# Patient Record
Sex: Female | Born: 1970 | Race: Black or African American | Hispanic: No | Marital: Married | Smoking: Never smoker
Health system: Southern US, Community
[De-identification: ages and names within clinical notes are randomized; demographics above are authoritative.]

---

## 2015-04-06 ENCOUNTER — Emergency Department (HOSPITAL_COMMUNITY)
Admission: EM | Admit: 2015-04-06 | Discharge: 2015-04-06 | Disposition: A | Discharge status: Home / Self Care | Payer: No Typology Code available for payment source | Attending: Physician Assistant | Admitting: Physician Assistant

## 2015-04-06 ENCOUNTER — Emergency Department (HOSPITAL_COMMUNITY): Payer: No Typology Code available for payment source

## 2015-04-06 ENCOUNTER — Encounter (HOSPITAL_COMMUNITY): Payer: Self-pay

## 2015-04-06 DIAGNOSIS — S3992XA Unspecified injury of lower back, initial encounter: Secondary | ICD-10-CM | POA: Diagnosis present

## 2015-04-06 DIAGNOSIS — Y998 Other external cause status: Secondary | ICD-10-CM | POA: Insufficient documentation

## 2015-04-06 DIAGNOSIS — S4992XA Unspecified injury of left shoulder and upper arm, initial encounter: Secondary | ICD-10-CM | POA: Diagnosis not present

## 2015-04-06 DIAGNOSIS — Y9389 Activity, other specified: Secondary | ICD-10-CM | POA: Diagnosis not present

## 2015-04-06 DIAGNOSIS — Y92410 Unspecified street and highway as the place of occurrence of the external cause: Secondary | ICD-10-CM | POA: Diagnosis not present

## 2015-04-06 MED ORDER — IBUPROFEN 800 MG PO TABS: 800.0000 mg | ORAL_TABLET | Freq: Three times a day (TID) | ORAL | Status: DC

## 2015-04-06 MED ORDER — CYCLOBENZAPRINE HCL 10 MG PO TABS: 10.0000 mg | ORAL_TABLET | Freq: Two times a day (BID) | ORAL | Status: DC | PRN

## 2015-04-06 MED ORDER — CYCLOBENZAPRINE HCL 10 MG PO TABS
10.0000 mg | ORAL_TABLET | Freq: Once | ORAL | Status: AC
  Administered 2015-04-06: 10 mg via ORAL
  Filled 2015-04-06: qty 1

## 2015-04-06 MED ORDER — IBUPROFEN 800 MG PO TABS
800.0000 mg | ORAL_TABLET | Freq: Once | ORAL | Status: AC
  Administered 2015-04-06: 800 mg via ORAL
  Filled 2015-04-06: qty 1

## 2015-04-06 NOTE — Discharge Instructions (Signed)
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
## 2015-04-06 NOTE — ED Notes (Signed)
Pt stable, ambulatory, states understanding of discharge instructions 
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
## 2015-04-26 ENCOUNTER — Emergency Department (HOSPITAL_COMMUNITY)
Admission: EM | Admit: 2015-04-26 | Discharge: 2015-04-26 | Disposition: A | Discharge status: Home / Self Care | Payer: No Typology Code available for payment source | Attending: Emergency Medicine | Admitting: Emergency Medicine

## 2015-04-26 ENCOUNTER — Encounter (HOSPITAL_COMMUNITY): Payer: Self-pay | Admitting: Emergency Medicine

## 2015-04-26 ENCOUNTER — Emergency Department (HOSPITAL_COMMUNITY): Payer: No Typology Code available for payment source

## 2015-04-26 DIAGNOSIS — M79602 Pain in left arm: Secondary | ICD-10-CM | POA: Diagnosis present

## 2015-04-26 DIAGNOSIS — R202 Paresthesia of skin: Secondary | ICD-10-CM | POA: Diagnosis not present

## 2015-04-26 DIAGNOSIS — S4992XD Unspecified injury of left shoulder and upper arm, subsequent encounter: Secondary | ICD-10-CM | POA: Insufficient documentation

## 2015-04-26 DIAGNOSIS — R531 Weakness: Secondary | ICD-10-CM | POA: Insufficient documentation

## 2015-04-26 DIAGNOSIS — R29898 Other symptoms and signs involving the musculoskeletal system: Secondary | ICD-10-CM

## 2015-04-26 MED ORDER — CYCLOBENZAPRINE HCL 10 MG PO TABS: 10.0000 mg | ORAL_TABLET | Freq: Two times a day (BID) | ORAL | Status: DC | PRN

## 2015-04-26 MED ORDER — IBUPROFEN 800 MG PO TABS: 800.0000 mg | ORAL_TABLET | Freq: Three times a day (TID) | ORAL | Status: DC

## 2015-04-26 MED ORDER — METHOCARBAMOL 500 MG PO TABS
500.0000 mg | ORAL_TABLET | Freq: Once | ORAL | Status: AC
  Administered 2015-04-26: 500 mg via ORAL
  Filled 2015-04-26: qty 1

## 2015-04-26 MED ORDER — IBUPROFEN 400 MG PO TABS
800.0000 mg | ORAL_TABLET | Freq: Once | ORAL | Status: AC
  Administered 2015-04-26: 800 mg via ORAL
  Filled 2015-04-26: qty 2
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
## 2015-04-26 NOTE — ED Notes (Signed)
WAITING FOR MRI

## 2015-04-26 NOTE — Discharge Instructions (Signed)
1. Medications: take ibuprofen and flexeril as needed for pain control, usual home medications 2. Treatment: rest, ice, elevate and use brace, drink plenty of fluids, gentle stretching 3. Follow Up: Please followup with orthopedics as directed or your PCP in 1 week if no improvement for discussion of your diagnoses and further evaluation after today's visit; if you do not have a primary care doctor use the resource guide provided to find one; Please return to the ER for worsening symptoms or other concerns   Emergency Department Resource Guide 1) Find a Doctor and Pay Out of Pocket Although you won't have to find out who is covered by your insurance plan, it is a good idea to ask around and get recommendations. You will then need to call the office and see if the doctor you have chosen will accept you as a new patient and what types of options they offer for patients who are self-pay. Some doctors offer discounts or will set up payment plans for their patients who do not have insurance, but you will need to ask so you aren't surprised when you get to your appointment.  2) Contact Your Local Health Department Not all health departments have doctors that can see patients for sick visits, but many do, so it is worth a call to see if yours does. If you don't know where your local health department is, you can check in your phone book. The CDC also has a tool to help you locate your state's health department, and many state websites also have listings of all of their local health departments.  3) Find a Walk-in Clinic If your illness is not likely to be very severe or complicated, you may want to try a walk in clinic. These are popping up all over the country in pharmacies, drugstores, and shopping centers. They're usually staffed by nurse practitioners or physician assistants that have been trained to treat common illnesses and complaints. They're usually fairly quick and inexpensive. However, if you have  serious medical issues or chronic medical problems, these are probably not your best option.  No Primary Care Doctor: - Call Health Connect at  [phone] - they can help you locate a primary care doctor that  accepts your insurance, provides certain services, etc. - Physician Referral Service- [phone]  Chronic Pain Problems: Organization         Address  Phone   Notes  [facility]  [phone] Patients need to be referred by their primary care doctor.   Medication Assistance: Organization         Address  Phone   Notes  [facility] [address] [place] [place], [place] [address] [phone] --Must be a resident of [facility] -- Must have NO insurance coverage whatsoever (no Medicaid/ Medicare, etc.) -- The pt. MUST have a primary care doctor that directs their care regularly and follows them in the community   [facility]  [phone]   [employer]  [phone]    Agencies that provide inexpensive medical care: Organization         Address  Phone   Notes  [facility]  [phone]   [facility]    [phone]   [facility] [address] [place], [place] [address] [phone]   [facility] [address] [place] [place] [phone]   [facility]anned [facility]    [phone]  [facility]    ([phone]) [phone]   °[facility] ° [address], [place] Phone:  ([phone]) [phone], Fax:  [phone] Hours of Operation:  9 am - 6 pm, M-F.  Also accepts Medicaid/Medicare and self-pay.  °[facility] ° [address], [place] Phone: ([phone]) [phone], Fax: [phone]. Hours of Operation:  8:30 am - 5:30 pm, M-F.  Also accepts Medicaid and self-pay.  °[facility] [place] [address], [place] Phone: ([phone]) [phone]   °[facility] [address],  [place], [place] (336)[phone], Ext. 123 Mondays & Thursdays: 7-9 AM.  First 15 patients are seen on a first come, first serve basis. °  ° °Medicaid-accepting [place] Providers: ° °Organization         Address  Phone   Notes  °[facility] [address], [place] ([phone]) [phone] Also accepts self-pay patients.  °[facility] [address], [place] ° (336) [phone]   °[facility] [address], [place] (336) [phone]   °[facility] [address] [place] [address], [place] (336) [phone]   °Veita Bland [address], [place]  ° ([phone]) [phone] Only accepts [place] Access Medicaid patients after they have their name applied to their card.  ° °Self-Pay (no insurance) in [place]: ° °Organization         Address  Phone   Notes  °Sickle Cell Patients, [facility] [address], [place] (336) [phone]   °[facility] [address], [place] (336) [phone]   °[facility] ° [address], [place] (336) [phone]   °[facility]/Dr. Osei-Bonsu ° [address] [place] [address], [place] or [address], [place] ([phone]) [phone] Phone number for both [place] and [place] locations is the same.  °[facility] [address], [place] (336) [phone]   °[facility] [address] [place] [address], [place] or [address] (336) 852-7530 °(336) 878-2260   °[facility] [address],  (336) 350-1642, phone; (336) [phone], fax Sees patients 1st and 3rd Saturday of every month.  Must not qualify for public or private insurance (i.e. Medicaid, Medicare, [place] Health Choice, Veterans' Benefits) • Household income should be no more than 200% of the poverty level •The clinic cannot treat you if you are pregnant or think you are pregnant •  Sexually transmitted diseases are not treated at the clinic.  ° ° °Dental Care: °Organization         Address  Phone  Notes  °[place] [facility] [address],  (336) [phone] Accepts children up to age 21 who are enrolled in Medicaid or [place] Health Choice; pregnant women with a Medicaid card; and children who have applied for Medicaid or [place] Health Choice, but were declined, whose parents can pay a reduced fee at time of service.  °[place] [facility] [place]  [address], [place] (336) 641-7733 Accepts children up to age 21 who are enrolled in Medicaid or [place] Health Choice; pregnant women with a Medicaid card; and children who have applied for Medicaid or [place] Health Choice, but were declined, whose parents can pay a reduced fee at time   of service.  °[facility] ° [address], [place] (336) [phone] Patients are seen by appointment only. Walk-ins are not accepted. [facility] will see patients 18 years of age and older. °Monday - Tuesday (8am-5pm) °Most Wednesdays (8:30-5pm) °$30 per visit, cash only  °[facility] ° [address], [place] (336) [phone] Patients are seen by appointment only. Walk-ins are not accepted. [facility] will see patients 18 years of age and older. °One Wednesday Evening (Monthly: Volunteer Based).  $30 per visit, cash only  °[facility]  ([phone]) [phone] for adults; Children under age 4, call Graduate Pediatric Dentistry at ([phone]. Children aged 4-14, please call [phone] to request a pediatric application. ° Dental services are provided in all areas of dental care including fillings, crowns and bridges, complete and partial dentures, implants, gum treatment, root canals, and extractions. Preventive care is also provided. Treatment is provided to both adults and children. °Patients  are selected via a lottery and there is often a waiting list. °  °[facility] [address], °[place] ° (336) 763-8833 [email or web address] °  °[facility] [address], [place], [place] (336)723-1848, Ext. 123 Second and Fourth Thursday of each month, opens at 6:30 AM; Clinic ends at 9 AM.  Patients are seen on a first-come first-served basis, and a limited number are seen during each clinic.  ° °[facility] ° [address], [place], [place] (336) [phone]   Eligibility Requirements °You must have lived in [place], [place], or [place] counties for at least the last three months. °  You cannot be eligible for state or federal sponsored healthcare insurance, including Veterans Administration, Medicaid, or Medicare. °  You generally cannot be eligible for healthcare insurance through your employer.  °  How to apply: °Eligibility screenings are held every Tuesday and Wednesday afternoon from 1:00 pm until 4:00 pm. You do not need an appointment for the interview!  °[facility] [address], [place], [place] [phone]   °[facility]  336-342-8273   °[facility]  336-703-3100   °[facility]  336-570-6415   ° °Behavioral Health Resources in the Community: °Intensive Outpatient Programs °Organization         Address  Phone  Notes  °[facility] [address], [place], [place] 336-878-6098   °[facility] [address], [place], [place] 336-832-9800   °[facility] [address], [place], [place] ° 336-882-2125   °[place] [address],  °[place], [place] 1-800-853-5163 or 336-641-4981   °Substance Abuse Resources °Organization         Address  Phone  Notes  °[facility]  336-882-2125   °[facility]  336-784-9470   °[facility]  336-285-9073   °[facility]  336-845-3988    °Residential & Outpatient Substance Abuse Program  1-800-659-3381   °Psychological Services °Organization         Address  Phone  Notes  °[facility]  336- 832-9600   °[facility]  336- 378-7881   °[facility] [address], [place] 1-800-853-5163 or 336-641-4981   ° °Mobile Crisis Teams °Organization         Address  Phone  Notes  °[facility], Mobile Crisis Care Unit  1-877-626-1772   °Assertive °Psychotherapeutic   Services  [address] [place], [place] [phone]   [facility] [address] [place] [phone]    Self-Help/Support Groups Organization         Address  Phone             Notes  [facility] - variety of support groups  336- I7437963(636)[phone] Call for more information  Narcotics Anonymous (NA), [facility] [address], [place]  2 meetings at this location   [facility] Treatment Programs Organization         Address  Phone  Notes  [facility] [address],    [place]  [phone]   [facility]  [address], [place] [phone], [place], [place] [phone]   [facility] [address], [place] [phone] Admissions: 8am-3pm M-F  [facility] [address]    [place], [place] [phone]   [facility] [address], [place], [place] [phone]   [facility] [address]  [place], [place] [phone]   [facility] Intensive Outpatient [address], [place], [place] [phone]   [facility] ([facility] [address]  [place], [place] [phone]) [phone] or [phone]   [facility] ([facility] [address] [place], [place] [phone] Accepts Medicaid  [facility] [address]  [place] [phone] Substance Abuse/Addiction Treatment   [facility]  Resources Organization         Address  Phone  Notes  [facility]  [phone](888) [phone]   Angie FavaJulie Brannon, PhD [address], [address] [place], [place]   [phone](336) [phone] or [phone](336) [phone]   [facility]   [address] [place], [place] [phone]) [phone]   [facility] [address], [place], [place] [phone]) [phone] Insurance/Medicaid/sponsorship through [facility]  [facility] [address]                                    [place], [place] [phone]) [phone] Therapy/tele-psych/case  [facility] [address]   [place], [place] [phone](336) [phone]    Dr. Lolly MustacheArfeen  [phone](336) [phone]   [facility]  [employer] [facility] 1) [address], [place] 2) [address], [place] 3)  [address], [place] [phone](336) [phone]  [phone]   [facility] Child Abuse Hotline [phone](336) [phone] or [phone](336) [phone] (After Hours)
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
## 2015-04-26 NOTE — ED Notes (Signed)
Patient still in MRI.  
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
## 2019-06-15 ENCOUNTER — Ambulatory Visit: Payer: 59 | Admitting: Allergy and Immunology

## 2020-10-30 ENCOUNTER — Encounter: Payer: Self-pay | Admitting: Gastroenterology

## 2021-01-08 ENCOUNTER — Encounter: Payer: Self-pay | Admitting: Gastroenterology

## 2021-04-04 ENCOUNTER — Other Ambulatory Visit: Payer: Self-pay

## 2021-04-04 ENCOUNTER — Emergency Department (HOSPITAL_BASED_OUTPATIENT_CLINIC_OR_DEPARTMENT_OTHER)
Admission: EM | Admit: 2021-04-04 | Discharge: 2021-04-04 | Disposition: A | Discharge status: Home / Self Care | Payer: BC Managed Care – PPO | Attending: Emergency Medicine | Admitting: Emergency Medicine

## 2021-04-04 ENCOUNTER — Encounter (HOSPITAL_BASED_OUTPATIENT_CLINIC_OR_DEPARTMENT_OTHER): Payer: Self-pay

## 2021-04-04 DIAGNOSIS — M546 Pain in thoracic spine: Secondary | ICD-10-CM | POA: Insufficient documentation

## 2021-04-04 DIAGNOSIS — R0789 Other chest pain: Secondary | ICD-10-CM | POA: Diagnosis present

## 2021-04-04 DIAGNOSIS — N644 Mastodynia: Secondary | ICD-10-CM | POA: Diagnosis not present

## 2021-04-04 DIAGNOSIS — B029 Zoster without complications: Secondary | ICD-10-CM | POA: Insufficient documentation

## 2021-04-04 LAB — COMPREHENSIVE METABOLIC PANEL
ALT: 27 U/L (ref 0–44)
AST: 25 U/L (ref 15–41)
Albumin: 4.3 g/dL (ref 3.5–5.0)
Alkaline Phosphatase: 106 U/L (ref 38–126)
Anion gap: 7 (ref 5–15)
BUN: 6 mg/dL (ref 6–20)
CO2: 29 mmol/L (ref 22–32)
Calcium: 9.5 mg/dL (ref 8.9–10.3)
Chloride: 104 mmol/L (ref 98–111)
Creatinine, Ser: 0.66 mg/dL (ref 0.44–1.00)
GFR, Estimated: >60 mL/min (ref >60)
Glucose, Bld: 90 mg/dL (ref 70–99)
Potassium: 3.8 mmol/L (ref 3.5–5.1)
Sodium: 140 mmol/L (ref 135–145)
Total Bilirubin: 0.6 mg/dL (ref 0.3–1.2)
Total Protein: 7.6 g/dL (ref 6.5–8.1)

## 2021-04-04 LAB — CBC
HCT: 39 % (ref 36.0–46.0)
Hemoglobin: 12.6 g/dL (ref 12.0–15.0)
MCH: 28 pg (ref 26.0–34.0)
MCHC: 32.3 g/dL (ref 30.0–36.0)
MCV: 86.7 fL (ref 80.0–100.0)
Platelets: 233 10*3/uL (ref 150–400)
RBC: 4.5 MIL/uL (ref 3.87–5.11)
RDW: 13.1 % (ref 11.5–15.5)
WBC: 4.1 10*3/uL (ref 4.0–10.5)
nRBC: 0 % (ref 0.0–0.2)

## 2021-04-04 LAB — URINALYSIS, ROUTINE W REFLEX MICROSCOPIC
Bilirubin Urine: NEGATIVE
Glucose, UA: NEGATIVE mg/dL
Hgb urine dipstick: NEGATIVE
Ketones, ur: NEGATIVE mg/dL
Nitrite: NEGATIVE
Protein, ur: NEGATIVE mg/dL
Specific Gravity, Urine: <1.005 — ABNORMAL LOW (ref 1.005–1.030)
pH: 6 (ref 5.0–8.0)

## 2021-04-04 LAB — TROPONIN I (HIGH SENSITIVITY)
Troponin I (High Sensitivity): 4 ng/L (ref <18)
Troponin I (High Sensitivity): 3 ng/L (ref <18)

## 2021-04-04 LAB — PREGNANCY, URINE: Preg Test, Ur: NEGATIVE

## 2021-04-04 LAB — LIPASE, BLOOD: Lipase: 29 U/L (ref 11–51)

## 2021-04-04 LAB — D-DIMER, QUANTITATIVE: D-Dimer, Quant: 0.34 ug/mL-FEU (ref 0.00–0.50)

## 2021-04-04 MED ORDER — VALACYCLOVIR HCL 1 G PO TABS: 1000.0000 mg | ORAL_TABLET | Freq: Three times a day (TID) | ORAL | 0 refills | Status: DC
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
## 2021-04-04 NOTE — Discharge Instructions (Addendum)
You were evaluated in the Emergency Department and after careful evaluation, we did not find any emergent condition requiring admission or further testing in the hospital.  Your exam/testing today was overall reassuring.  We have ruled out heart attack with cardiac enzymes and EKG, ruled out fluid on your lungs with an ultrasound, ruled out fluid on your heart with an ultrasound, ruled out blood clot in your lungs with laboratory testing.  Your clinical presentation is most consistent with shingles.  I have prescribed Valtrex which is a 3 times a day medication.  It is a white pill.  I have attached additional educational materials to your AVS regarding shingles.  Please return to the Emergency Department if you experience any worsening of your condition.  Thank you for allowing [place] to be a part of your care.

## 2021-04-04 NOTE — ED Provider Notes (Signed)
[facility] EMERGENCY DEPT Provider Note   CSN: [id number] Arrival date & time: 04/04/21  5638     History Chief Complaint  Patient presents with   Chest Pain    Lynn Blair is a 50 y.o. female.   Chest Pain Associated symptoms: back pain   Associated symptoms: no abdominal pain, no cough, no fever, no palpitations, no shortness of breath and no vomiting    50 year old female with no significant past medical history presenting to the emergency department with a sensation of "something filling up under my ribs."  She endorses a burning pain in her left thoracic back that radiates around to her left breast.  She states the pain has been present since last night.  She denies a history of shingles.  She endorses a component of pressure with the pain.  She is having difficulty characterizing the discomfort to me on HPI.  She denies any shortness of breath.  She denies any fevers or chills.  She denies any cough.  She denies any other infectious symptoms.  Nothing makes her symptoms better or worse.  She denies any vesicular rash.  History reviewed. No pertinent past medical history.  There are no problems to display for this patient.   History reviewed. No pertinent surgical history.   OB History   No obstetric history on file.     No family history on file.  Social History   Tobacco Use   Smoking status: Never   Smokeless tobacco: Never  Vaping Use   Vaping Use: Never used  Substance Use Topics   Alcohol use: No   Drug use: No    Home Medications Prior to Admission medications   Medication Sig Start Date End Date Taking? Authorizing Provider  valACYclovir (VALTREX) 1000 MG tablet Take 1 tablet (1,000 mg total) by mouth 3 (three) times daily. 04/04/21  Yes Ernie Avena, MD  cyclobenzaprine (FLEXERIL) 10 MG tablet Take 1 tablet (10 mg total) by mouth 2 (two) times daily as needed for muscle spasms. 04/26/15   Muthersbaugh, Dahlia Client, PA-C  ibuprofen  (ADVIL,MOTRIN) 800 MG tablet Take 1 tablet (800 mg total) by mouth 3 (three) times daily. 04/26/15   Muthersbaugh, Dahlia Client, PA-C    Allergies    Citrus, Red dye, Shellfish allergy, and Yellow dyes (non-tartrazine)  Review of Systems   Review of Systems  Constitutional:  Negative for chills and fever.  HENT:  Negative for ear pain and sore throat.   Eyes:  Negative for pain and visual disturbance.  Respiratory:  Negative for cough and shortness of breath.   Cardiovascular:  Positive for chest pain. Negative for palpitations.  Gastrointestinal:  Negative for abdominal pain and vomiting.  Genitourinary:  Negative for dysuria and hematuria.  Musculoskeletal:  Positive for back pain. Negative for arthralgias.  Skin:  Negative for color change and rash.  Neurological:  Negative for seizures and syncope.  All other systems reviewed and are negative.  Physical Exam Updated Vital Signs BP (!) 141/86   Pulse 75   Temp 98.3 F (36.8 C)   Resp (!) 25   Ht 5' 6.5" (1.689 m)   Wt 81.6 kg   SpO2 100%   BMI 28.62 kg/m   Physical Exam Vitals and nursing note reviewed.  Constitutional:      General: She is not in acute distress.    Appearance: She is well-developed.  HENT:     Head: Normocephalic and atraumatic.  Eyes:     Conjunctiva/sclera: Conjunctivae normal.  Pupils: Pupils are equal, round, and reactive to light.  Cardiovascular:     Rate and Rhythm: Normal rate and regular rhythm.     Heart sounds: No murmur heard. Pulmonary:     Effort: Pulmonary effort is normal. No respiratory distress.     Breath sounds: Normal breath sounds.  Chest:     Comments: Tenderness to light touch of the left chest wall in a dermatomal distribution around to the left back Abdominal:     General: There is no distension.     Palpations: Abdomen is soft.     Tenderness: There is no abdominal tenderness. There is no guarding.  Musculoskeletal:        General: No deformity or signs of injury.      Cervical back: Normal range of motion and neck supple.  Skin:    General: Skin is warm and dry.     Findings: No lesion or rash.  Neurological:     General: No focal deficit present.     Mental Status: She is alert. Mental status is at baseline.    ED Results / Procedures / Treatments   Labs (all labs ordered are listed, but only abnormal results are displayed) Labs Reviewed  URINALYSIS, ROUTINE W REFLEX MICROSCOPIC - Abnormal; Notable for the following components:      Result Value   Color, Urine COLORLESS (*)    Specific Gravity, Urine <1.005 (*)    Leukocytes,Ua SMALL (*)    Bacteria, UA RARE (*)    All other components within normal limits  LIPASE, BLOOD  COMPREHENSIVE METABOLIC PANEL  CBC  PREGNANCY, URINE  D-DIMER, QUANTITATIVE  TROPONIN I (HIGH SENSITIVITY)  TROPONIN I (HIGH SENSITIVITY)    EKG EKG Interpretation  Date/Time:  Friday April 04 2021 08:53:55 EDT Ventricular Rate:  66 PR Interval:  162 QRS Duration: 82 QT Interval:  383 QTC Calculation: 402 R Axis:   15 Text Interpretation: Sinus rhythm RSR' in V1 or V2, probably normal variant Confirmed by Ernie Avena ([id number]) on 04/04/2021 10:29:00 AM  Radiology No results found.  Procedures Procedures   Medications Ordered in ED Medications - No data to display  ED Course  I have reviewed the triage vital signs and the nursing notes.  Pertinent labs & imaging results that were available during my care of the patient were reviewed by me and considered in my medical decision making (see chart for details).    MDM Rules/Calculators/A&P                           50 year old female presenting to the emergency department with vague symptoms of back and chest discomfort that started last night.  Symptoms sound fairly consistent with herpes zoster infection although I see no rash development yet.  She does have chest discomfort and tenderness to light touch in a dermatomal distribution from her back  around to her chest wall which may be consistent with early herpes zoster infection.  Given her report of chest discomfort, will evaluate further with EKG and screening labs.  Ultrasound was performed which revealed no evidence of pneumothorax or pericardial effusion or pleural effusion.  A chest x-ray was ordered but the patient declined a chest x-ray as she was anxious to leave the emergency department.  Troponins x2 were collected and resulted negative.  I have low suspicion for ACS at this time.  A D-dimer was also collected and negative.  Low suspicion for PE.  CBC without leukocytosis, CMP and lipase unremarkable.  Urinalysis without evidence of UTI.  Overall symptoms are most consistent with a diagnosis of likely early herpes zoster.  Will prescribe Valtrex and have the patient follow-up with her PCP as needed.  Final Clinical Impression(s) / ED Diagnoses Final diagnoses:  Herpes zoster without complication    Rx / DC Orders ED Discharge Orders          Ordered    valACYclovir (VALTREX) 1000 MG tablet  3 times daily        04/04/21 1245             Ernie Avena, MD 04/05/21 1841

## 2021-05-21 ENCOUNTER — Ambulatory Visit (INDEPENDENT_AMBULATORY_CARE_PROVIDER_SITE_OTHER): Payer: BC Managed Care – PPO | Admitting: Cardiovascular Disease

## 2021-05-21 ENCOUNTER — Encounter (HOSPITAL_BASED_OUTPATIENT_CLINIC_OR_DEPARTMENT_OTHER): Payer: Self-pay | Admitting: Cardiovascular Disease

## 2021-05-21 ENCOUNTER — Other Ambulatory Visit: Payer: Self-pay

## 2021-05-21 ENCOUNTER — Ambulatory Visit (INDEPENDENT_AMBULATORY_CARE_PROVIDER_SITE_OTHER): Payer: BC Managed Care – PPO

## 2021-05-21 VITALS — BP 136/90 | HR 67 | Ht 66.5 in | Wt 195.0 lb

## 2021-05-21 DIAGNOSIS — R030 Elevated blood-pressure reading, without diagnosis of hypertension: Secondary | ICD-10-CM | POA: Diagnosis not present

## 2021-05-21 DIAGNOSIS — R0681 Apnea, not elsewhere classified: Secondary | ICD-10-CM

## 2021-05-21 DIAGNOSIS — R002 Palpitations: Secondary | ICD-10-CM | POA: Diagnosis not present

## 2021-05-21 DIAGNOSIS — R0683 Snoring: Secondary | ICD-10-CM | POA: Insufficient documentation

## 2021-05-21 HISTORY — DX: Snoring: R06.83

## 2021-05-21 HISTORY — DX: Palpitations: R00.2

## 2021-05-21 HISTORY — DX: Elevated blood-pressure reading, without diagnosis of hypertension: R03.0

## 2021-05-21 NOTE — Progress Notes (Unsigned)
Enrolled patient for a 3 day Zio Monitor to be mailed to patients home.  

## 2021-05-21 NOTE — Patient Instructions (Addendum)
Medication Instructions:  Your physician recommends that you continue on your current medications as directed. Please refer to the Current Medication list given to you today.   *If you need a refill on your cardiac medications before your next appointment, please call your pharmacy*  Lab Work: NONE   Testing/Procedures: Your physician has recommended that you have a sleep study. This test records several body functions during sleep, including: brain activity, eye movement, oxygen and carbon dioxide blood levels, heart rate and rhythm, breathing rate and rhythm, the flow of air through your mouth and nose, snoring, body muscle movements, and chest and belly movement. ONCE YOUR INSURANCE HAS BEE REVIEWED THE OFFICE WILL CALL YOU TO ARRANGE. IF YOU DO NOT HEAR IN 2 WEEKS PLEASE CALL TO FOLLOW UP   3 DAY ZIO THIS WILL BE MAILED TO YOU   Follow-Up: At [facility], you and your health needs are our priority.  As part of our continuing mission to provide you with exceptional heart care, we have created designated Provider Care Teams.  These Care Teams include your primary Cardiologist (physician) and Advanced Practice Providers (APPs -  Physician Assistants and Nurse Practitioners) who all work together to provide you with the care you need, when you need it.  We recommend signing up for the patient portal called "MyChart".  Sign up information is provided on this After Visit Summary.  MyChart is used to connect with patients for Virtual Visits (Telemedicine).  Patients are able to view lab/test results, encounter notes, upcoming appointments, etc.  Non-urgent messages can be sent to your provider as well.   To learn more about what you can do with MyChart, go to [email or web address].    Your next appointment:   6 month(s)  The format for your next appointment:   In Person  Provider:   Chilton Si, MD   Your physician recommends that you schedule a follow-up appointment in: [place]  W NP OR JESSE C NP IN 2 MONTHS  Other Instructions  ZIO XT- Long Term Monitor Instructions  Your physician has requested you wear a ZIO patch monitor for 3 days.  This is a single patch monitor. [employer] supplies one patch monitor per enrollment. Additional stickers are not available. Please do not apply patch if you will be having a Nuclear Stress Test,  Echocardiogram, Cardiac CT, MRI, or Chest Xray during the period you would be wearing the  monitor. The patch cannot be worn during these tests. You cannot remove and re-apply the  ZIO XT patch monitor.  Your ZIO patch monitor will be mailed 3 day USPS to your address on file. It may take 3-5 days  to receive your monitor after you have been enrolled.  Once you have received your monitor, please review the enclosed instructions. Your monitor  has already been registered assigning a specific monitor serial # to you.  Billing and Patient Assistance Program Information  We have supplied [employer] with any of your insurance information on file for billing purposes. [employer] offers a sliding scale Patient Assistance Program for patients that do not have  insurance, or whose insurance does not completely cover the cost of the ZIO monitor.  You must apply for the Patient Assistance Program to qualify for this discounted rate.  To apply, please call [employer] at [phone], select option 4, select option 2, ask to apply for  Patient Assistance Program. Meredeth Ide will ask your household income, and how many people  are in your household. They will quote your  out-of-pocket cost based on that information.  [employer] will also be able to set up a 35-month, interest-free payment plan if needed.  Applying the monitor   Shave hair from upper left chest.  Hold abrader disc by orange tab. Rub abrader in 40 strokes over the upper left chest as  indicated in your monitor instructions.  Clean area with 4 enclosed alcohol pads. Let dry.  Apply patch as  indicated in monitor instructions. Patch will be placed under collarbone on left  side of chest with arrow pointing upward.  Rub patch adhesive wings for 2 minutes. Remove white label marked "1". Remove the white  label marked "2". Rub patch adhesive wings for 2 additional minutes.  While looking in a mirror, press and release button in center of patch. A small green light will  flash 3-4 times. This will be your only indicator that the monitor has been turned on.  Do not shower for the first 24 hours. You may shower after the first 24 hours.  Press the button if you feel a symptom. You will hear a small click. Record Date, Time and  Symptom in the Patient Logbook.  When you are ready to remove the patch, follow instructions on the last 2 pages of Patient  Logbook. Stick patch monitor onto the last page of Patient Logbook.  Place Patient Logbook in the blue and white box. Use locking tab on box and tape box closed  securely. The blue and white box has prepaid postage on it. Please place it in the mailbox as  soon as possible. Your physician should have your test results approximately 7 days after the  monitor has been mailed back to [facility].  Call [facility] Customer Care at [phone] if you have questions regarding  your ZIO XT patch monitor. Call them immediately if you see an orange light blinking on your  monitor.  If your monitor falls off in less than 4 days, contact our Monitor department at [phone].  If your monitor becomes loose or falls off after 4 days call [employer] at [phone] for  suggestions on securing your monitor

## 2021-05-21 NOTE — Assessment & Plan Note (Signed)
She has snoring and daytime somnolence.  Her husband reports she isn't breathing well.  She also notes significant weight gain.  We will get a sleep study.

## 2021-05-21 NOTE — Progress Notes (Signed)
Cardiology Office Note:    Date:  05/21/2021   ID:  Lynn Blair, DOB 1971-04-07, MRN [id number]  PCP:  Pcp, No   [facility] Providers Cardiologist:  None     Referring MD: Lynn Graff, MD   No chief complaint on file.   History of Present Illness:    Lynn Blair is a 50 y.o. female with no pertinent cardiovascular hx, here for the evaluation of palpitations. She saw Dr. Normand Blair on 10/2020 and reported palpitations. She was seen in the ED 04/04/2021 with chest pain. She had tenderness to touch in the left chest wall that radiated to the left back in a dermatomal distribution. There was concern for shingles, though there was no rash on exam. She was prescribed Valtrex. Today, she reports that for at least the past 6 months, she has suffered from nocturnal palpitations. Initially, she was unable to lie on her left side due to exacerbation of her palpitations. This is bothersome as she prefers to sleep on her left side. The palpitations will usually occur within a short time, before she is able to fall asleep. Lately, she is unable to lie on her right side. She also notes numbness in her forearm if she tries to prop herself up on her side to sleep. Earlier this summer she exercised regularly in fitness classes. At that time she woke up in the mornings with racing heart beats. This type of palpitations only occurred for a few days, and has not recurred. Typically she is sitting down all day for work. For exercise she works out with resistance bands 3 days a week. During exercise, she denies feeling her heart racing at a concerning level. Until the beginning of this year, she was eating well and in the [facility]. She was down to 173 lbs. This year she has been under a lot of stress and grief, and has not been monitoring her diet as strictly. She was avoiding meat until 11/2020, when she began eating chicken. Typically she drinks 2-3 cups of coffee a day. Her husband  reports that she snores, and that her breathing may become "off" at night. Additionally, she is concerned about recent discoloration of her right LE on her ankle. She has followed up with a vein specialist. On her left side, she has a lump-like lesion that is sometimes palpable. She denies any chest pain, or shortness of breath. No lightheadedness, headaches, syncope, orthopnea, PND, lower extremity edema or exertional symptoms.   Past Medical History:  Diagnosis Date   Elevated blood pressure reading 05/21/2021   Palpitations 05/21/2021   Snoring 05/21/2021    No past surgical history on file.  Current Medications: No outpatient medications have been marked as taking for the 05/21/21 encounter (Office Visit) with Lynn Si, MD.     Allergies:   Citrus, Red dye, Shellfish allergy, and Yellow dyes (non-tartrazine)   Social History   Socioeconomic History   Marital status: Married    Spouse name: Not on file   Number of children: Not on file   Years of education: Not on file   Highest education level: Not on file  Occupational History   Not on file  Tobacco Use   Smoking status: Never   Smokeless tobacco: Never  Vaping Use   Vaping Use: Never used  Substance and Sexual Activity   Alcohol use: No   Drug use: No   Sexual activity: Not on file  Other Topics Concern   Not on  file  Social History Narrative   Not on file   Social Determinants of Health   Financial Resource Strain: Low Risk    Difficulty of Paying Living Expenses: Not very hard  Food Insecurity: No Food Insecurity   Worried About Programme researcher, broadcasting/film/video in the Last Year: Never true   Ran Out of Food in the Last Year: Never true  Transportation Needs: No Transportation Needs   Lack of Transportation (Medical): No   Lack of Transportation (Non-Medical): No  Physical Activity: Insufficiently Active   Days of Exercise per Week: 3 days   Minutes of Exercise per Session: 30 min  Stress: Not on file   Social Connections: Not on file     Family History: The patient's family history includes Valvular heart disease in her maternal aunt.  ROS:   Please see the history of present illness.    (+) Palpitations (+) Stress/Grief (+) Snoring (+) Skin discoloration of right LE near ankle (+) Lump-like lesion, left side All other systems reviewed and are negative.  EKGs/Labs/Other Studies Reviewed:    The following studies were reviewed today: No prior cardiovascular studies available.  EKG:    05/21/2021: Sinus rhythm. Rate 67 bpm.  Recent Labs: 04/04/2021: ALT 27; BUN 6; Creatinine, Ser 0.66; Hemoglobin 12.6; Platelets 233; Potassium 3.8; Sodium 140   Recent Lipid Panel No results found for: CHOL, TRIG, HDL, CHOLHDL, VLDL, LDLCALC, LDLDIRECT       Physical Exam:    Wt Readings from Last 3 Encounters:  05/21/21 195 lb (88.5 kg)  04/04/21 180 lb (81.6 kg)  04/26/15 180 lb (81.6 kg)     VS:  BP 136/90 (BP Location: Right Arm, Patient Position: Sitting, Cuff Size: Large)   Pulse 67   Ht 5' 6.5" (1.689 m)   Wt 195 lb (88.5 kg)   BMI 31.00 kg/m  , BMI Body mass index is 31 kg/m. GENERAL:  Well appearing HEENT: Pupils equal round and reactive, fundi not visualized, oral mucosa unremarkable NECK:  No jugular venous distention, waveform within normal limits, carotid upstroke brisk and symmetric, no bruits, no thyromegaly LUNGS:  Clear to auscultation bilaterally HEART:  RRR.  PMI not displaced or sustained,S1 and S2 within normal limits, no S3, no S4, no clicks, no rubs, no murmurs ABD:  Flat, positive bowel sounds normal in frequency in pitch, no bruits, no rebound, no guarding, no midline pulsatile mass, no hepatomegaly, no splenomegaly EXT:  2 plus pulses throughout, no edema, no cyanosis no clubbing SKIN:  No rashes no nodules NEURO:  Cranial nerves II through XII grossly intact, motor grossly intact throughout PSYCH:  Cognitively intact, oriented to person place and  time   ASSESSMENT:    1. Apnea   2. Palpitations   3. Snoring   4. Elevated blood pressure reading    PLAN:    Palpitations She has palpitations when lying on her R and left sides.  She doesn't have palpitations with exertion.  TSH was normal earlier this year.  She snores and had significant weight gain in the last year.  We will get a 3 day Zio and a sleep study.  She already had the appropriate lab work.  Snoring She has snoring and daytime somnolence.  Her husband reports she isn't breathing well.  She also notes significant weight gain.  We will get a sleep study.  Elevated blood pressure reading She is working on diet and exercise.  Goal is <130/80.  Continue to monitor for now.  Disposition: FU with APP in 2 months. FU with Dawnya Grams C. Duke Salvia, MD, [facility] in 6 months.  Medication Adjustments/Labs and Tests Ordered: Current medicines are reviewed at length with the patient today.  Concerns regarding medicines are outlined above.   Orders Placed This Encounter  Procedures   LONG TERM MONITOR (3-14 DAYS)   EKG 12-Lead   Split night study     No orders of the defined types were placed in this encounter.   Patient Instructions  Medication Instructions:  Your physician recommends that you continue on your current medications as directed. Please refer to the Current Medication list given to you today.   *If you need a refill on your cardiac medications before your next appointment, please call your pharmacy*  Lab Work: NONE   Testing/Procedures: Your physician has recommended that you have a sleep study. This test records several body functions during sleep, including: brain activity, eye movement, oxygen and carbon dioxide blood levels, heart rate and rhythm, breathing rate and rhythm, the flow of air through your mouth and nose, snoring, body muscle movements, and chest and belly movement. ONCE YOUR INSURANCE HAS BEE REVIEWED THE OFFICE WILL CALL YOU TO ARRANGE.  IF YOU DO NOT HEAR IN 2 WEEKS PLEASE CALL TO FOLLOW UP   3 DAY ZIO THIS WILL BE MAILED TO YOU   Follow-Up: At [facility], you and your health needs are our priority.  As part of our continuing mission to provide you with exceptional heart care, we have created designated Provider Care Teams.  These Care Teams include your primary Cardiologist (physician) and Advanced Practice Providers (APPs -  Physician Assistants and Nurse Practitioners) who all work together to provide you with the care you need, when you need it.  We recommend signing up for the patient portal called "MyChart".  Sign up information is provided on this After Visit Summary.  MyChart is used to connect with patients for Virtual Visits (Telemedicine).  Patients are able to view lab/test results, encounter notes, upcoming appointments, etc.  Non-urgent messages can be sent to your provider as well.   To learn more about what you can do with MyChart, go to [email or web address].    Your next appointment:   6 month(s)  The format for your next appointment:   In Person  Provider:   Chilton Si, MD   Your physician recommends that you schedule a follow-up appointment in: CAIT[place] W NP OR JESSE C NP IN 2 MONTHS  Other Instructions  ZIO XT- Long Term Monitor Instructions  Your physician has requested you wear a ZIO patch monitor for 3 days.  This is a single patch monitor. [employer] supplies one patch monitor per enrollment. Additional stickers are not available. Please do not apply patch if you will be having a Nuclear Stress Test,  Echocardiogram, Cardiac CT, MRI, or Chest Xray during the period you would be wearing the  monitor. The patch cannot be worn during these tests. You cannot remove and re-apply the  ZIO XT patch monitor.  Your ZIO patch monitor will be mailed 3 day USPS to your address on file. It may take 3-5 days  to receive your monitor after you have been enrolled.  Once you have received your  monitor, please review the enclosed instructions. Your monitor  has already been registered assigning a specific monitor serial # to you.  Billing and Patient Assistance Program Information  We have supplied [employer] with any of your insurance information on file for billing purposes.  [employer] offers a sliding scale Patient Assistance Program for patients that do not have  insurance, or whose insurance does not completely cover the cost of the ZIO monitor.  You must apply for the Patient Assistance Program to qualify for this discounted rate.  To apply, please call [employer] at [phone], select option 4, select option 2, ask to apply for  Patient Assistance Program. Meredeth Ide will ask your household income, and how many people  are in your household. They will quote your out-of-pocket cost based on that information.  [employer] will also be able to set up a 70-month, interest-free payment plan if needed.  Applying the monitor   Shave hair from upper left chest.  Hold abrader disc by orange tab. Rub abrader in 40 strokes over the upper left chest as  indicated in your monitor instructions.  Clean area with 4 enclosed alcohol pads. Let dry.  Apply patch as indicated in monitor instructions. Patch will be placed under collarbone on left  side of chest with arrow pointing upward.  Rub patch adhesive wings for 2 minutes. Remove white label marked "1". Remove the white  label marked "2". Rub patch adhesive wings for 2 additional minutes.  While looking in a mirror, press and release button in center of patch. A small green light will  flash 3-4 times. This will be your only indicator that the monitor has been turned on.  Do not shower for the first 24 hours. You may shower after the first 24 hours.  Press the button if you feel a symptom. You will hear a small click. Record Date, Time and  Symptom in the Patient Logbook.  When you are ready to remove the patch, follow instructions on the last 2  pages of Patient  Logbook. Stick patch monitor onto the last page of Patient Logbook.  Place Patient Logbook in the blue and white box. Use locking tab on box and tape box closed  securely. The blue and white box has prepaid postage on it. Please place it in the mailbox as  soon as possible. Your physician should have your test results approximately 7 days after the  monitor has been mailed back to [facility].  Call [facility] Customer Care at [phone] if you have questions regarding  your ZIO XT patch monitor. Call them immediately if you see an orange light blinking on your  monitor.  If your monitor falls off in less than 4 days, contact our Monitor department at [phone].  If your monitor becomes loose or falls off after 4 days call [employer] at [phone] for  suggestions on securing your monitor    I,Mathew Stumpf,acting as a scribe for Lynn Si, MD.,have documented all relevant documentation on the behalf of Lynn Si, MD,as directed by  Lynn Si, MD while in the presence of Lynn Si, MD.  I, Haani Bakula C. Duke Salvia, MD have reviewed all documentation for this visit.  The documentation of the exam, diagnosis, procedures, and orders on 05/21/2021 are all accurate and complete.   Guadalupe Maple  05/21/2021 5:33 PM    [facility]

## 2021-05-21 NOTE — Assessment & Plan Note (Signed)
She is working on diet and exercise.  Goal is <130/80.  Continue to monitor for now.

## 2021-05-21 NOTE — Assessment & Plan Note (Signed)
She has palpitations when lying on her R and left sides.  She doesn't have palpitations with exertion.  TSH was normal earlier this year.  She snores and had significant weight gain in the last year.  We will get a 3 day Zio and a sleep study.  She already had the appropriate lab work.

## 2021-05-23 ENCOUNTER — Other Ambulatory Visit: Payer: Self-pay | Admitting: Cardiovascular Disease

## 2021-05-23 ENCOUNTER — Telehealth: Payer: Self-pay | Admitting: *Deleted

## 2021-05-23 DIAGNOSIS — R002 Palpitations: Secondary | ICD-10-CM

## 2021-05-23 DIAGNOSIS — R0683 Snoring: Secondary | ICD-10-CM

## 2021-05-23 DIAGNOSIS — R400 Somnolence: Secondary | ICD-10-CM

## 2021-05-23 NOTE — Telephone Encounter (Signed)
Left message that her [employer] denied in lab sleep study. Per Dr Duke Salvia ok to schedule HST. Appointment details left on voicemail.

## 2021-05-24 DIAGNOSIS — R002 Palpitations: Secondary | ICD-10-CM

## 2021-05-26 ENCOUNTER — Telehealth: Payer: Self-pay | Admitting: Cardiovascular Disease

## 2021-05-26 NOTE — Telephone Encounter (Signed)
Routed to monitor team to assist with ZIO concerns

## 2021-05-26 NOTE — Telephone Encounter (Signed)
Pt would like a return call for assistance with setting up her Zio Monitor. Pt states she followed the directions on the box but it's still not working correctly

## 2021-05-26 NOTE — Telephone Encounter (Signed)
Patient had found her instruction paperwork given to her at the office.  She has applied her monitor and feels confident it is working.  Confirmed it was ordered for 3 days.

## 2021-06-05 ENCOUNTER — Telehealth (HOSPITAL_BASED_OUTPATIENT_CLINIC_OR_DEPARTMENT_OTHER): Payer: Self-pay | Admitting: *Deleted

## 2021-06-05 NOTE — Telephone Encounter (Signed)
Patient came in regarding her heart monitor results , she stated she sent it back last week Wednesday . She was wondering when she should get results. [phone]

## 2021-06-09 ENCOUNTER — Ambulatory Visit (HOSPITAL_BASED_OUTPATIENT_CLINIC_OR_DEPARTMENT_OTHER): Payer: BC Managed Care – PPO | Attending: Cardiovascular Disease | Admitting: Cardiovascular Disease

## 2021-06-09 DIAGNOSIS — R0683 Snoring: Secondary | ICD-10-CM | POA: Diagnosis not present

## 2021-06-09 DIAGNOSIS — R400 Somnolence: Secondary | ICD-10-CM | POA: Diagnosis not present

## 2021-06-09 DIAGNOSIS — R002 Palpitations: Secondary | ICD-10-CM

## 2021-06-09 DIAGNOSIS — R030 Elevated blood-pressure reading, without diagnosis of hypertension: Secondary | ICD-10-CM

## 2021-06-09 DIAGNOSIS — G4733 Obstructive sleep apnea (adult) (pediatric): Secondary | ICD-10-CM

## 2021-06-11 ENCOUNTER — Ambulatory Visit (HOSPITAL_BASED_OUTPATIENT_CLINIC_OR_DEPARTMENT_OTHER): Payer: BC Managed Care – PPO | Admitting: Family Medicine

## 2021-06-12 ENCOUNTER — Encounter (HOSPITAL_BASED_OUTPATIENT_CLINIC_OR_DEPARTMENT_OTHER): Payer: Self-pay

## 2021-06-23 ENCOUNTER — Encounter (HOSPITAL_BASED_OUTPATIENT_CLINIC_OR_DEPARTMENT_OTHER): Payer: Self-pay | Admitting: Cardiovascular Disease

## 2021-06-23 NOTE — Procedures (Signed)
° ° ° °  Patient Name: Lynn Blair, Lynn Blair Date: 06/10/2021 Gender: Female D.O.B: 13-Apr-1971 Age (years): 50 Referring Provider: Chilton Si Height (inches): 66 Interpreting Physician: Nicki Guadalajara MD, ABSM Weight (lbs): 195 RPSGT: [place] BMI: 31 MRN: [id number] Neck Size: <br>  CLINICAL INFORMATION Sleep Study Type: HST  Indication for sleep study: snoring, palpitations, elevated BP  Epworth Sleepiness Score: 5  SLEEP STUDY TECHNIQUE A multi-channel overnight portable sleep study was performed. The channels recorded were: nasal airflow, thoracic respiratory movement, and oxygen saturation with a pulse oximetry. Snoring was also monitored.  MEDICATIONS None Patient self administered medications include: N/A.  SLEEP ARCHITECTURE Patient was studied for 399.5 minutes. The sleep efficiency was 100.0 % and the patient was supine for 96%. The arousal index was 0.0 per hour.  RESPIRATORY PARAMETERS The overall AHI was 8.6 per hour, with a central apnea index of 0 per hour.  The oxygen nadir was 79% during sleep.  CARDIAC DATA Mean heart rate during sleep was 82.2 bpm.  IMPRESSIONS - Mild obstructive sleep apnea (AHI 8.6/h). The severity during REM sleepo cannot be assessed on this study.  - Severe oxygen desaturation to a nadir of 79%. - Patient snored 44.2% (176.5 minutes) during the sleep.  DIAGNOSIS - Obstructive Sleep Apnea (G47.33)  RECOMMENDATIONS - Therapeutic CPAP titration to determine optimal pressure required to alleviate sleep disordered breathing.  Initiate a trial of Auto-PAP with EPR of 3 at 6 - 16 cm of water.  - Effort should be made to optimize nasal and oropharyngeal patency. - If patient is against CPAP initiation can consider a customized oral appliance as an alternative of treatment.  - Avoid alcohol, sedatives and other CNS depressants that may worsen sleep apnea and disrupt normal sleep architecture. - Sleep hygiene should be  reviewed to assess factors that may improve sleep quality. - Weight management and regular exercise should be initiated or continued. - Recommend aa download in 30 days and sleep clinic evaluation after one month of therapy.   [Electronically signed] 06/23/2021 09:16 AM  Nicki Guadalajara MD, [facility], ABSM Diplomate, American Board of Sleep Medicine   NPI: [id number]   [facility] PH: [phone]   FX: [phone] ACCREDITED BY THE AMERICAN ACADEMY OF SLEEP MEDICINE

## 2021-07-11 ENCOUNTER — Other Ambulatory Visit: Payer: Self-pay | Admitting: Obstetrics and Gynecology

## 2021-07-16 ENCOUNTER — Ambulatory Visit (HOSPITAL_BASED_OUTPATIENT_CLINIC_OR_DEPARTMENT_OTHER): Payer: BC Managed Care – PPO | Admitting: Family Medicine

## 2021-07-21 ENCOUNTER — Ambulatory Visit (HOSPITAL_BASED_OUTPATIENT_CLINIC_OR_DEPARTMENT_OTHER): Payer: BC Managed Care – PPO | Admitting: Family

## 2021-08-06 ENCOUNTER — Ambulatory Visit: Admit: 2021-08-06 | Payer: BC Managed Care – PPO | Admitting: Obstetrics and Gynecology

## 2021-08-06 SURGERY — HYSTERECTOMY, VAGINAL, LAPAROSCOPY-ASSISTED, WITH SALPINGECTOMY
Anesthesia: Choice

## 2021-09-09 ENCOUNTER — Other Ambulatory Visit: Payer: Self-pay

## 2021-09-09 ENCOUNTER — Encounter: Payer: Self-pay | Admitting: Allergy and Immunology

## 2021-09-09 ENCOUNTER — Ambulatory Visit (INDEPENDENT_AMBULATORY_CARE_PROVIDER_SITE_OTHER): Payer: BC Managed Care – PPO | Admitting: Allergy and Immunology

## 2021-09-09 VITALS — BP 144/88 | HR 105 | Temp 96.7°F | Resp 20 | Ht 66.5 in | Wt 194.6 lb

## 2021-09-09 DIAGNOSIS — J301 Allergic rhinitis due to pollen: Secondary | ICD-10-CM

## 2021-09-09 DIAGNOSIS — J3089 Other allergic rhinitis: Secondary | ICD-10-CM

## 2021-09-09 DIAGNOSIS — T781XXA Other adverse food reactions, not elsewhere classified, initial encounter: Secondary | ICD-10-CM

## 2021-09-09 MED ORDER — MONTELUKAST SODIUM 10 MG PO TABS: 10.0000 mg | ORAL_TABLET | Freq: Every day | ORAL | 5 refills | Status: AC

## 2021-09-09 MED ORDER — CETIRIZINE HCL 10 MG PO TABS: 10.0000 mg | ORAL_TABLET | Freq: Two times a day (BID) | ORAL | 5 refills | Status: AC | PRN

## 2021-09-09 MED ORDER — EPINEPHRINE 0.3 MG/0.3ML IJ SOAJ: 0.3000 mg | Freq: Once | INTRAMUSCULAR | 1 refills | Status: AC

## 2021-09-09 NOTE — Progress Notes (Signed)
?[employer] [facility] [employer] [facility] [place] [facility] [place] - [place] ? ? ?NEW PATIENT NOTE ? ?Referring Provider: No ref. provider found ?Primary Provider: Caffie Damme, MD ?Date of office visit: 09/09/2021 ?   ?Subjective:  ? ?Chief Complaint:  Lynn Blair (DOB: May 07, 1971) is a 51 y.o. female who presents to the clinic on 09/09/2021 with a chief complaint of Establish Care ?.    ? ?HPI: Lynn Blair presents to this clinic in evaluation of allergies. ? ?Apparently in 2017 she started to have problems with food consumption with the development of throat closing and throat itching and mucus buildup.  This appeared to coincide with the consumption of mixed nuts, cheer wine, bananas, apples, carrots, and eggs.  She apparently had some testing done which show that she was allergic to multiple foods and multiple aeroallergens and red dye and yellow dye.  She is interested in using a course of immunotherapy to correct this issue. ? ?She also has a long history of allergic rhinoconjunctivitis especially during the spring and fall with nasal congestion and sneezing and itchy red watery eyes for which she takes montelukast and occasionally some cetirizine which works pretty well.  She cannot tolerate nasal steroids. ? ?Past Medical History:  ?Diagnosis Date  ? Elevated blood pressure reading 05/21/2021  ? Palpitations 05/21/2021  ? Snoring 05/21/2021  ? ? ?History reviewed. No pertinent surgical history. ? ?Allergies as of 09/09/2021   ? ?   Reactions  ? Citrus Anaphylaxis  ? Red Dye Anaphylaxis  ? Shellfish Allergy Anaphylaxis  ? Yellow Dyes (non-tartrazine) Anaphylaxis  ? Penicillins Rash, Other (See Comments)  ? Reaction as a child ?Reaction as a child  ? Eggs Or Egg-derived Products   ? Peanut Extract Allergy Skin Test Itching  ? ?  ? ?  ?Medication List  ? ? ?aspirin 81 MG EC tablet ?Take 1 tablet by mouth as needed. ?  ? ?Review of systems negative except as noted in HPI / PMHx or noted below: ? ?Review of Systems  ?Constitutional:  Negative.   ?HENT: Negative.    ?Eyes: Negative.   ?Respiratory: Negative.    ?Cardiovascular: Negative.   ?Gastrointestinal: Negative.   ?Genitourinary: Negative.   ?Musculoskeletal: Negative.   ?Skin: Negative.   ?Neurological: Negative.   ?Endo/Heme/Allergies: Negative.   ?Psychiatric/Behavioral: Negative.    ? ?Family History  ?Problem Relation Age of Onset  ? Valvular heart disease Maternal Aunt   ? ? ?Social History  ? ?Socioeconomic History  ? Marital status: Married  ?  Spouse name: Not on file  ? Number of children: Not on file  ? Years of education: Not on file  ? Highest education level: Not on file  ?Occupational History  ? Not on file  ?Tobacco Use  ? Smoking status: Never  ? Smokeless tobacco: Never  ?Vaping Use  ? Vaping Use: Never used  ?Substance and Sexual Activity  ? Alcohol use: No  ? Drug use: No  ? Sexual activity: Not on file  ?Other Topics Concern  ? Not on file  ?Social History Narrative  ? Not on file  ? ?Social Determinants of Health  ? ?Financial Resource Strain: Low Risk   ? Difficulty of Paying Living Expenses: Not very hard  ?Food Insecurity: No Food Insecurity  ? Worried About Programme researcher, broadcasting/film/video in the Last Year: Never true  ? Ran Out of Food in the Last Year: Never true  ?Transportation Needs: No Transportation Needs  ? Lack of Transportation (Medical): No  ?  Lack of Transportation (Non-Medical): No  ?Physical Activity: Insufficiently Active  ? Days of Exercise per Week: 3 days  ? Minutes of Exercise per Session: 30 min  ?Stress: Not on file  ?Social Connections: Not on file  ?Intimate Partner Violence: Not on file  ? ? ?Environmental and Social history ? ?Lives in a house with a dry environment, no animals located inside the household, no carpet in the bedroom, no plastic on the bed, no plastic on the pillow, and no smoking ongoing with inside the household. ? ?Objective:  ? ?Vitals:  ? 09/09/21 1416  ?BP: (!) 144/88  ?Pulse: (!) 105  ?Resp: 20  ?Temp: (!) 96.7 ?F (35.9 ?C)  ?SpO2:  100%  ? ?Height: 5' 6.5" (168.9 cm) ?Weight: 194 lb 9.6 oz (88.3 kg) ? ?Physical Exam ?Constitutional:   ?   Appearance: She is not diaphoretic.  ?HENT:  ?   Head: Normocephalic.  ?   Right Ear: Tympanic membrane, ear canal and external ear normal.  ?   Left Ear: Tympanic membrane, ear canal and external ear normal.  ?   Nose: Nose normal. No mucosal edema or rhinorrhea.  ?   Mouth/Throat:  ?   Pharynx: Uvula midline. No oropharyngeal exudate.  ?Eyes:  ?   Conjunctiva/sclera: Conjunctivae normal.  ?Neck:  ?   Thyroid: No thyromegaly.  ?   Trachea: Trachea normal. No tracheal tenderness or tracheal deviation.  ?Cardiovascular:  ?   Rate and Rhythm: Normal rate and regular rhythm.  ?   Heart sounds: Normal heart sounds, S1 normal and S2 normal. No murmur heard. ?Pulmonary:  ?   Effort: No respiratory distress.  ?   Breath sounds: Normal breath sounds. No stridor. No wheezing or rales.  ?Lymphadenopathy:  ?   Head:  ?   Right side of head: No tonsillar adenopathy.  ?   Left side of head: No tonsillar adenopathy.  ?   Cervical: No cervical adenopathy.  ?Skin: ?   Findings: No erythema or rash.  ?   Nails: There is no clubbing.  ?Neurological:  ?   Mental Status: She is alert.  ? ? ?Diagnostics: Allergy skin tests were not performed.  ? ?Assessment and Plan:  ? ? ?1. Adverse food reaction, initial encounter   ?2. Pollen-food allergy, initial encounter   ?3. Perennial allergic rhinitis   ?4. Seasonal allergic rhinitis due to pollen   ? ?1. Blood - area 2 aeroallergen profile, nut panel w/r, egg panel w/r, shellfish panel, yellow dye, red dye ? ?2. If needed: ? ?A. Montelukast 10 mg - 1 tablet 1 time per day ?B. Cetirizine 10 mg - 1 tablet 1 time per day ?C. Epi-pen, benadryl, MD/ER evaluation for allergic reaction ? ?3. Immunotherapy??? ? ?4. Further evaluation and treatment??? ? ?Lynn Blair has a history consistent with atopic disease and we will work through this issue with the blood test noted above and she can continue to  use a leukotriene modifier and a antihistamine as needed and of course an EpiPen should it be required.  She would be a candidate for immunotherapy.  I will contact her with the results of her blood test once they are available for review. ? ?Jessica Priest, MD ?Allergy / Immunology ?[facility] ?

## 2021-09-09 NOTE — Patient Instructions (Addendum)
?  1. Blood - area 2 aeroallergen profile, nut panel w/r, egg panel w/r, shellfish panel, yellow dye, red dye ? ?2. If needed: ? ?A. Montelukast 10 mg - 1 tablet 1 time per day ?B. Cetirizine 10 mg - 1 tablet 1 time per day ?C. Epi-pen, benadryl, MD/ER evaluation for allergic reaction ? ?3. Immunotherapy??? ? ?4. Further evaluation and treatment??? ?

## 2021-09-10 ENCOUNTER — Encounter: Payer: Self-pay | Admitting: Allergy and Immunology

## 2021-09-13 LAB — ALLERGEN PROFILE, SHELLFISH
Clam IgE: <0.1 kU/L
F023-IgE Crab: <0.1 kU/L
F080-IgE Lobster: <0.1 kU/L
F290-IgE Oyster: <0.1 kU/L
Scallop IgE: <0.1 kU/L
Shrimp IgE: 0.12 kU/L — AB

## 2021-09-13 LAB — PANEL 604726
Cor A 1 IgE: 15.7 kU/L — AB
Cor A 14 IgE: <0.1 kU/L
Cor A 8 IgE: <0.1 kU/L
Cor A 9 IgE: <0.1 kU/L

## 2021-09-13 LAB — ALLERGENS W/TOTAL IGE AREA 2
Alternaria Alternata IgE: <0.1 kU/L
Aspergillus Fumigatus IgE: <0.1 kU/L
Bermuda Grass IgE: 6 kU/L — AB
Cat Dander IgE: 0.37 kU/L — AB
Cedar, Mountain IgE: 2.77 kU/L — AB
Cladosporium Herbarum IgE: <0.1 kU/L
Cockroach, German IgE: <0.1 kU/L
Common Silver Birch IgE: 15.8 kU/L — AB
Cottonwood IgE: 2.27 kU/L — AB
D Farinae IgE: 0.16 kU/L — AB
D Pteronyssinus IgE: 0.18 kU/L — AB
Dog Dander IgE: 14.3 kU/L — AB
Elm, American IgE: 2.93 kU/L — AB
IgE (Immunoglobulin E), Serum: 108 IU/mL (ref 6–495)
Johnson Grass IgE: 2.29 kU/L — AB
Maple/Box Elder IgE: 4.36 kU/L — AB
Mouse Urine IgE: <0.1 kU/L
Oak, White IgE: 17.1 kU/L — AB
Pecan, Hickory IgE: 3.45 kU/L — AB
Penicillium Chrysogen IgE: <0.1 kU/L
Pigweed, Rough IgE: 4.36 kU/L — AB
Ragweed, Short IgE: 21.1 kU/L — AB
Sheep Sorrel IgE Qn: 4.4 kU/L — AB
Timothy Grass IgE: 6.27 kU/L — AB
White Mulberry IgE: 0.13 kU/L — AB

## 2021-09-13 LAB — PEANUT COMPONENTS
F352-IgE Ara h 8: 8.6 kU/L — AB
F422-IgE Ara h 1: <0.1 kU/L
F423-IgE Ara h 2: <0.1 kU/L
F424-IgE Ara h 3: <0.1 kU/L
F427-IgE Ara h 9: <0.1 kU/L
F447-IgE Ara h 6: <0.1 kU/L

## 2021-09-13 LAB — IGE NUT PROF. W/COMPONENT RFLX
F017-IgE Hazelnut (Filbert): 7.49 kU/L — AB
F018-IgE Brazil Nut: <0.1 kU/L
F020-IgE Almond: 2.31 kU/L — AB
F202-IgE Cashew Nut: <0.1 kU/L
F203-IgE Pistachio Nut: 0.22 kU/L — AB
F256-IgE Walnut: 0.65 kU/L — AB
Macadamia Nut, IgE: 0.21 kU/L — AB
Peanut, IgE: 1.37 kU/L — AB
Pecan Nut IgE: <0.1 kU/L

## 2021-09-13 LAB — EGG COMPONENT PANEL
F232-IgE Ovalbumin: <0.1 kU/L
F233-IgE Ovomucoid: <0.1 kU/L

## 2021-09-13 LAB — ALLERGEN, ANNATTO SEED
Annatto Seed IgE*: 0.95 kU/L — ABNORMAL HIGH (ref <0.35)
Class Interpretation: 2

## 2021-09-13 LAB — PANEL 604721
Jug R 1 IgE: <0.1 kU/L
Jug R 3 IgE: <0.1 kU/L

## 2021-09-13 LAB — IGE EGG WHITE W/COMPONENT RFLX: F001-IgE Egg White: <0.1 kU/L

## 2021-09-13 LAB — ALLERGEN, RED (CARMINE) DYE, RF340: F340-IgE Carmine Red Dye: 0.12 kU/L — AB

## 2021-09-13 LAB — ALLERGEN COMPONENT COMMENTS
